# Patient Record
Sex: Male | Born: 1990 | Hispanic: Yes | Marital: Single | Smoking: Current every day smoker
Health system: Southern US, Community
[De-identification: ages and names within clinical notes are randomized; demographics above are authoritative.]

## PROBLEM LIST (undated history)

## (undated) HISTORY — PX: WISDOM TOOTH EXTRACTION: SHX21

---

## 2015-05-02 ENCOUNTER — Emergency Department (HOSPITAL_COMMUNITY): Payer: Commercial Managed Care - PPO

## 2015-05-02 ENCOUNTER — Emergency Department (HOSPITAL_COMMUNITY)
Admission: EM | Admit: 2015-05-02 | Discharge: 2015-05-02 | Disposition: A | Discharge status: Home / Self Care | Payer: Commercial Managed Care - PPO | Attending: Emergency Medicine | Admitting: Emergency Medicine

## 2015-05-02 ENCOUNTER — Encounter (HOSPITAL_COMMUNITY): Payer: Self-pay | Admitting: Emergency Medicine

## 2015-05-02 DIAGNOSIS — R1013 Epigastric pain: Secondary | ICD-10-CM | POA: Diagnosis not present

## 2015-05-02 DIAGNOSIS — R1010 Upper abdominal pain, unspecified: Secondary | ICD-10-CM | POA: Diagnosis present

## 2015-05-02 DIAGNOSIS — Z720 Tobacco use: Secondary | ICD-10-CM | POA: Diagnosis not present

## 2015-05-02 LAB — URINALYSIS, ROUTINE W REFLEX MICROSCOPIC
Bilirubin Urine: NEGATIVE
GLUCOSE, UA: NEGATIVE mg/dL
Hgb urine dipstick: NEGATIVE
Ketones, ur: NEGATIVE mg/dL
LEUKOCYTES UA: NEGATIVE
Nitrite: NEGATIVE
PROTEIN: NEGATIVE mg/dL
Specific Gravity, Urine: 1.01 (ref 1.005–1.030)
Urobilinogen, UA: 0.2 mg/dL (ref 0.0–1.0)
pH: 6.5 (ref 5.0–8.0)

## 2015-05-02 LAB — CBC WITH DIFFERENTIAL/PLATELET
BASOS ABS: 0 10*3/uL (ref 0.0–0.1)
BASOS PCT: 0 %
EOS ABS: 0 10*3/uL (ref 0.0–0.7)
EOS PCT: 0 %
HCT: 44.7 % (ref 39.0–52.0)
Hemoglobin: 15.6 g/dL (ref 13.0–17.0)
Lymphocytes Relative: 43 %
Lymphs Abs: 3 10*3/uL (ref 0.7–4.0)
MCH: 30.7 pg (ref 26.0–34.0)
MCHC: 34.9 g/dL (ref 30.0–36.0)
MCV: 88 fL (ref 78.0–100.0)
Monocytes Absolute: 0.4 10*3/uL (ref 0.1–1.0)
Monocytes Relative: 6 %
Neutro Abs: 3.5 10*3/uL (ref 1.7–7.7)
Neutrophils Relative %: 51 %
PLATELETS: 228 10*3/uL (ref 150–400)
RBC: 5.08 MIL/uL (ref 4.22–5.81)
RDW: 12.5 % (ref 11.5–15.5)
WBC: 7 10*3/uL (ref 4.0–10.5)

## 2015-05-02 LAB — COMPREHENSIVE METABOLIC PANEL
ALBUMIN: 4.9 g/dL (ref 3.5–5.0)
ALT: 13 U/L — AB (ref 17–63)
AST: 21 U/L (ref 15–41)
Alkaline Phosphatase: 60 U/L (ref 38–126)
Anion gap: 11 (ref 5–15)
BUN: 14 mg/dL (ref 6–20)
CHLORIDE: 102 mmol/L (ref 101–111)
CO2: 25 mmol/L (ref 22–32)
CREATININE: 0.74 mg/dL (ref 0.61–1.24)
Calcium: 9.1 mg/dL (ref 8.9–10.3)
GFR calc non Af Amer: >60 mL/min (ref >60)
Glucose, Bld: 96 mg/dL (ref 65–99)
Potassium: 3.3 mmol/L — ABNORMAL LOW (ref 3.5–5.1)
SODIUM: 138 mmol/L (ref 135–145)
Total Bilirubin: 0.8 mg/dL (ref 0.3–1.2)
Total Protein: 7.9 g/dL (ref 6.5–8.1)

## 2015-05-02 LAB — LIPASE, BLOOD: Lipase: 31 U/L (ref 22–51)

## 2015-05-02 MED ORDER — ONDANSETRON HCL 4 MG/2ML IJ SOLN
INTRAMUSCULAR | Status: AC
  Administered 2015-05-02: 4 mg via INTRAVENOUS
  Filled 2015-05-02: qty 2

## 2015-05-02 MED ORDER — SODIUM CHLORIDE 0.9 % IV BOLUS (SEPSIS)
1000.0000 mL | Freq: Once | INTRAVENOUS | Status: AC
  Administered 2015-05-02: 1000 mL via INTRAVENOUS

## 2015-05-02 MED ORDER — ONDANSETRON HCL 4 MG/2ML IJ SOLN
4.0000 mg | Freq: Once | INTRAMUSCULAR | Status: AC
  Administered 2015-05-02: 4 mg via INTRAVENOUS

## 2015-05-02 MED ORDER — OMEPRAZOLE 20 MG PO CPDR: 20.0000 mg | DELAYED_RELEASE_CAPSULE | Freq: Every day | ORAL | Status: DC

## 2015-05-02 NOTE — ED Provider Notes (Signed)
CSN: [id number]   Arrival date & time 05/02/15 0126  History  This chart was scribed for Eric Munch, MD by Bethel Born, ED Scribe. This patient was seen in room WA11/WA11 and the patient's care was started at 2:18 AM.  Chief Complaint  Patient presents with  . Abdominal Pain    HPI The history is provided by the patient. No language interpreter was used.   Isam Nanetti-Palacios is a 24 y.o. male who presents to the Emergency Department complaining of intermittent mid upper abdominal pain with onset 1 week ago. The most recent episode started 1 hour ago. Pt rates the pain 5/10 in severity. The pain is worse at night and with eating. Nothing improves the pain. He took nothing for pain PTA. Associated symptoms include nausea. He has been urinating less frequently. Pt denies diarrhea or other change in stool, change in appetite, chest pain, SOB. No recent diet change. No history of abdominal surgery. No tobacco use. Infrequent alcohol use.   History reviewed. No pertinent past medical history.  History reviewed. No pertinent past surgical history.  No family history on file.  Social History  Substance Use Topics  . Smoking status: Current Every Day Smoker  . Smokeless tobacco: None  . Alcohol Use: Yes     Review of Systems  Constitutional:       Per HPI, otherwise negative  HENT:       Per HPI, otherwise negative  Respiratory:       Per HPI, otherwise negative  Cardiovascular:       Per HPI, otherwise negative  Gastrointestinal: Negative for vomiting.  Endocrine:       Negative aside from HPI  Genitourinary:       Neg aside from HPI   Musculoskeletal:       Per HPI, otherwise negative  Skin: Negative.   Neurological: Negative for syncope.   Home Medications   Prior to Admission medications   Medication Sig Start Date End Date Taking? Authorizing Provider  ibuprofen (ADVIL,MOTRIN) 200 MG tablet Take 400 mg by mouth every 6 (six) hours as needed for moderate pain.    Yes Historical Provider, MD    Allergies  Review of patient's allergies indicates no known allergies.  Triage Vitals: BP 159/104 mmHg  Pulse 138  Temp(Src) 99.2 F (37.3 C) (Oral)  Resp 20  SpO2 99%  Physical Exam  Constitutional: He is oriented to person, place, and time. He appears well-developed. No distress.  HENT:  Head: Normocephalic and atraumatic.  Eyes: Conjunctivae and EOM are normal.  Cardiovascular: Normal rate and regular rhythm.   Pulmonary/Chest: Effort normal. No stridor. No respiratory distress.  Abdominal: He exhibits no distension.  Musculoskeletal: He exhibits no edema.  Neurological: He is alert and oriented to person, place, and time.  Skin: Skin is warm and dry.  Psychiatric: He has a normal mood and affect.  Nursing note and vitals reviewed.   ED Course  Procedures  COORDINATION OF CARE: 2:22 AM Discussed treatment plan which includes lab work, abdominal US, IVF, and antiemetic with pt at bedside and pt agreed to plan.  Labs Reviewed  COMPREHENSIVE METABOLIC PANEL - Abnormal; Notable for the following:    Potassium 3.3 (*)    ALT 13 (*)    All other components within normal limits  CBC WITH DIFFERENTIAL/PLATELET  LIPASE, BLOOD  URINALYSIS, ROUTINE W REFLEX MICROSCOPIC (NOT AT [facility])    Imaging Review US Abdomen Limited  05/02/2015   CLINICAL DATA:  Right  upper quadrant pain.  EXAM: US ABDOMEN LIMITED - RIGHT UPPER QUADRANT  COMPARISON:  None.  FINDINGS: Gallbladder:  No gallstones or wall thickening visualized. No sonographic Murphy sign noted.  Common bile duct:  Diameter: 2.8 mm, normal  Liver:  No focal lesion identified. Within normal limits in parenchymal echogenicity.  IMPRESSION: Normal examination.   Electronically Signed   By: Burman Nieves M.D.   On: 05/02/2015 03:14    3:57 AM On repeat exam the patient is pain-free. Now, the patient's family on room, states that he has been smoking substantial amounts of marijuana, daily, and  has been eating inconsistently.  We discussed all findings at length, including possibility of gastroesophageal etiology, given his reassuring findings. Patient denies any ongoing pain at all. We discussed return precautions, empiric therapy with PPI, gastroenterology follow-up.  MDM   I personally performed the services described in this documentation, which was scribed in my presence. The recorded information has been reviewed and is accurate.   Well appearing M presents with episodic epigastric discomfort. History the patient is awake, alert, with no evidence for perforated ulcer disease, nor peritonitis. No evidence for appendicitis, and the patient is pain-free after initial interventions here. With reassuring findings, ultrasound, the patient was discharged in stable condition to follow-up with gastroenterology.  Eric Munch, MD 05/02/15 [phone]

## 2015-05-02 NOTE — Discharge Instructions (Signed)
As discussed, your evaluation today has been largely reassuring.  But, it is important that you monitor your condition carefully, and do not hesitate to return to the ED if you develop new, or concerning changes in your condition. ? ?Otherwise, please follow-up with your physician for appropriate ongoing care. ? ?

## 2015-05-02 NOTE — ED Notes (Signed)
Pt c/o sharp "quick" pain to right side of abd. States its been going on x 2 weeks off and on but now it is staying. Pt now c/o nausea.

## 2017-02-20 IMAGING — US US ABDOMEN LIMITED
1 series · 14 of 25 positions shown · non-contrast
Comparison: None.

CLINICAL DATA: Right upper quadrant pain.

EXAM:
US ABDOMEN LIMITED - RIGHT UPPER QUADRANT

[Series 1: us abdomen limited · 0.16mm/px · 14 of 41 slices shown]
[im 1/41]
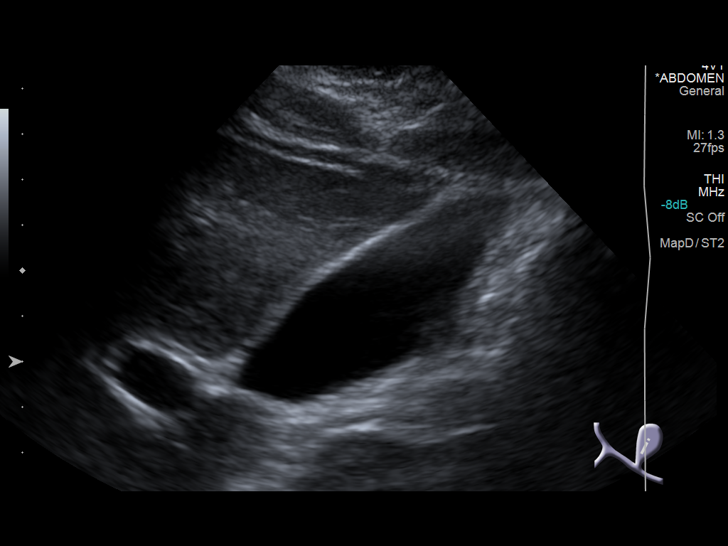
[im 4/41]
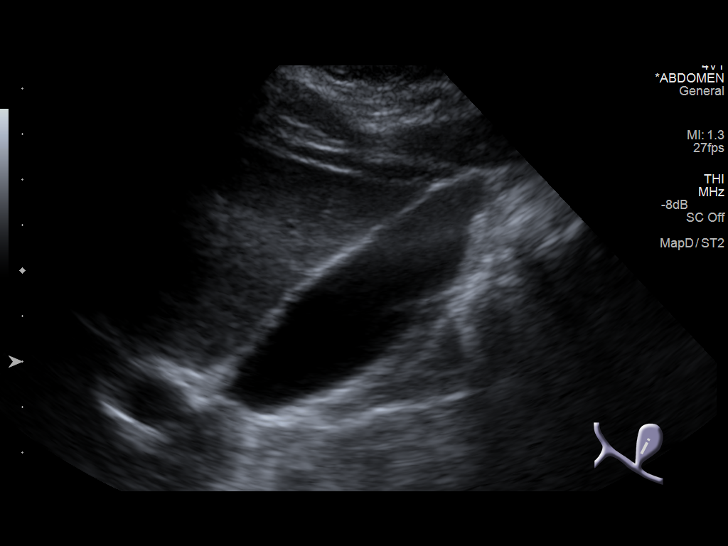
[im 7/41]
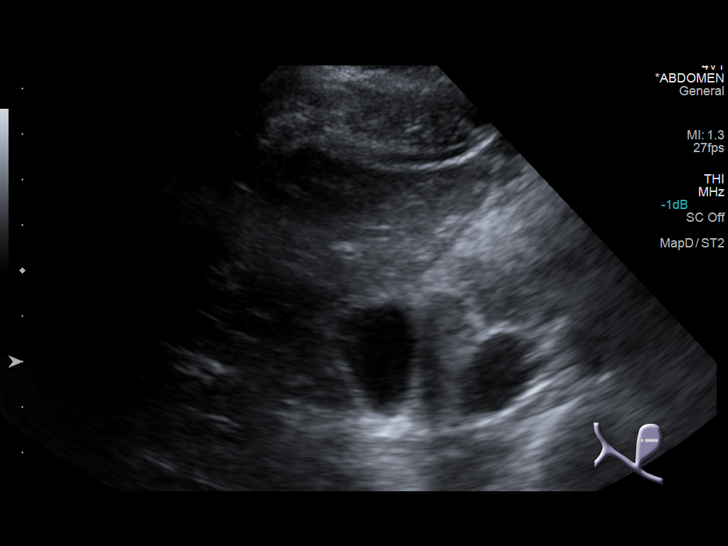
[im 11/41]
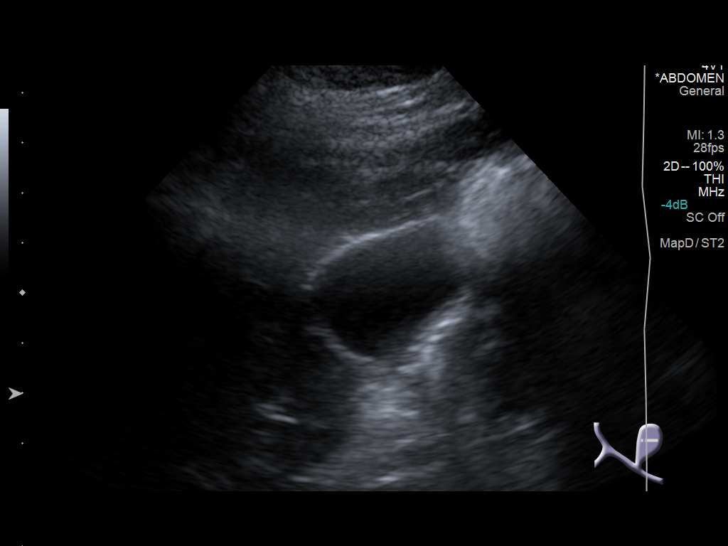
[im 14/41]
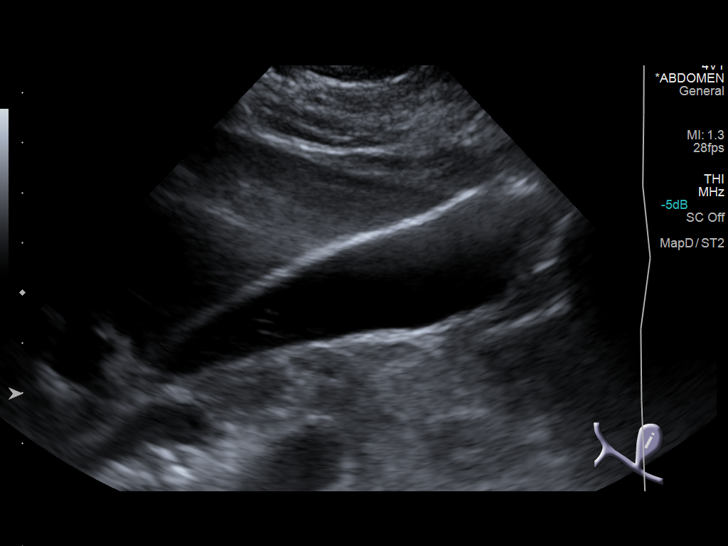
[im 16/41]
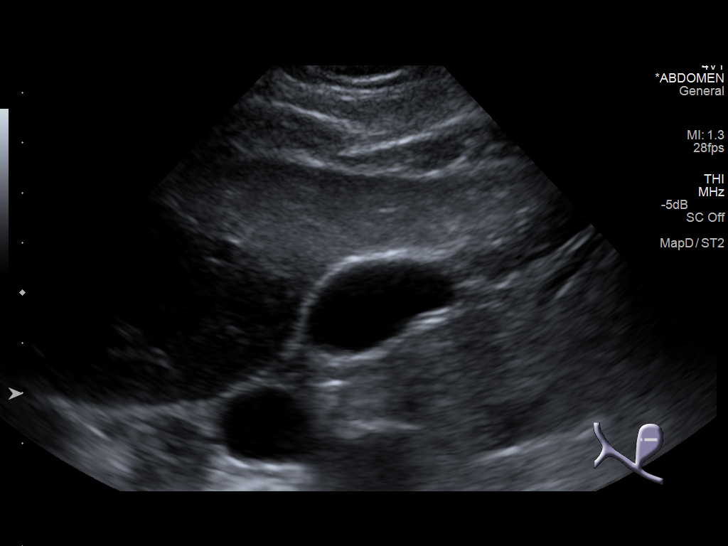
[im 19/41]
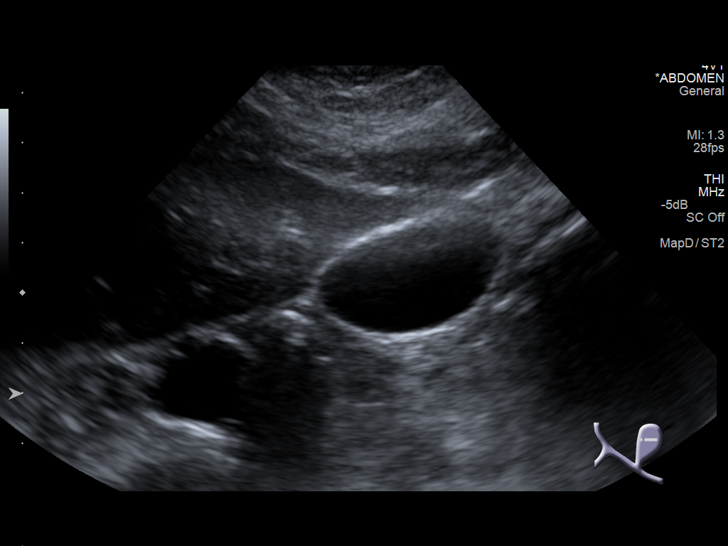
[im 22/41]
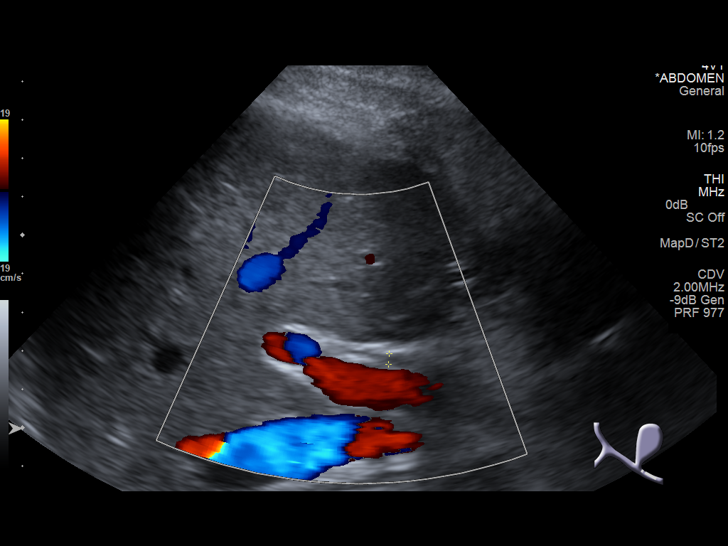
[im 26/41]
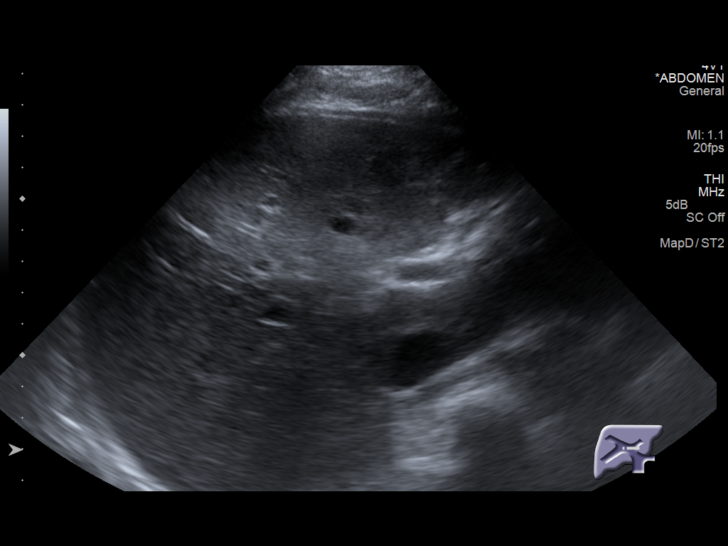
[im 27/41]
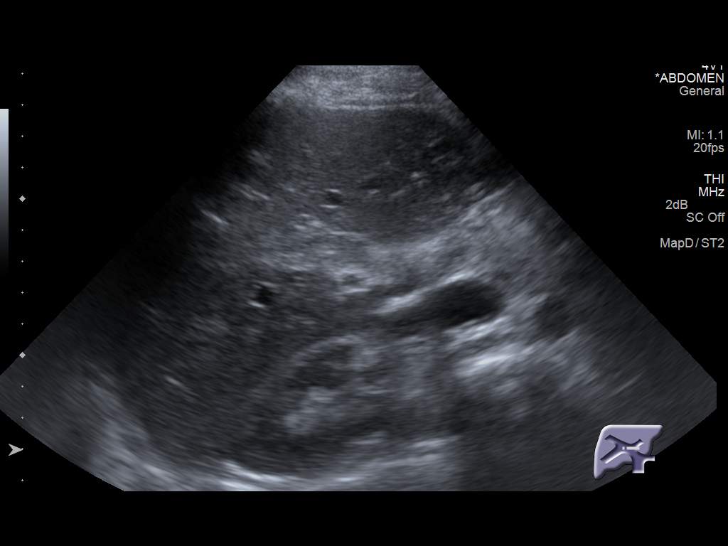
[im 31/41]
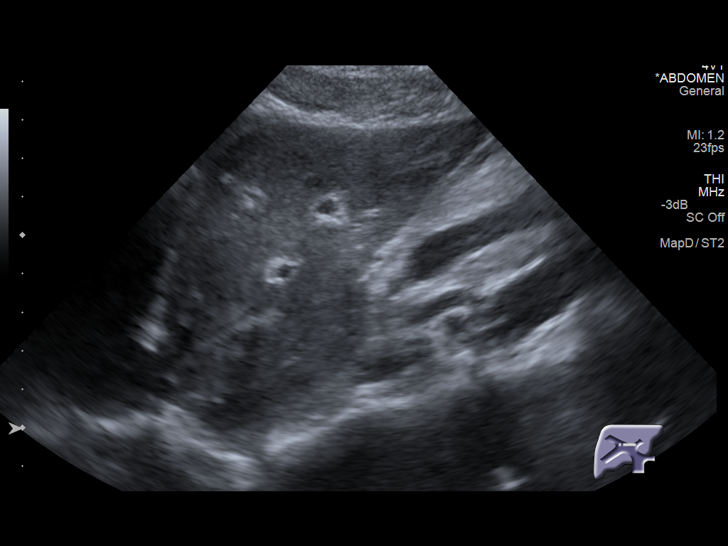
[im 34/41]
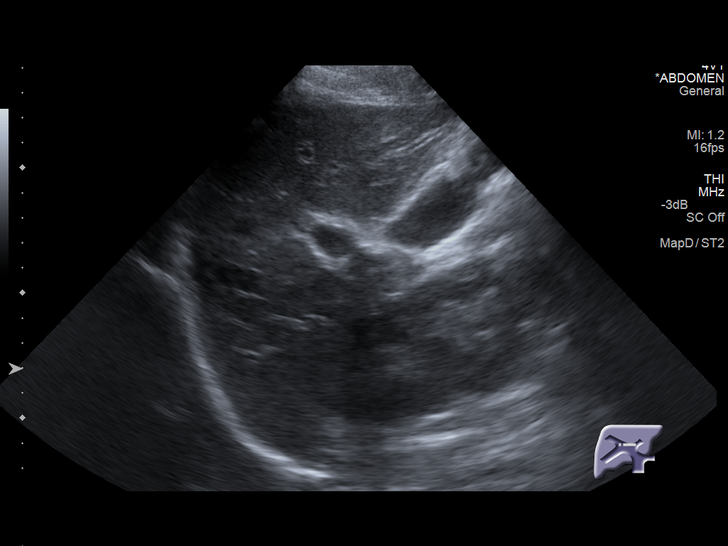
[im 37/41]
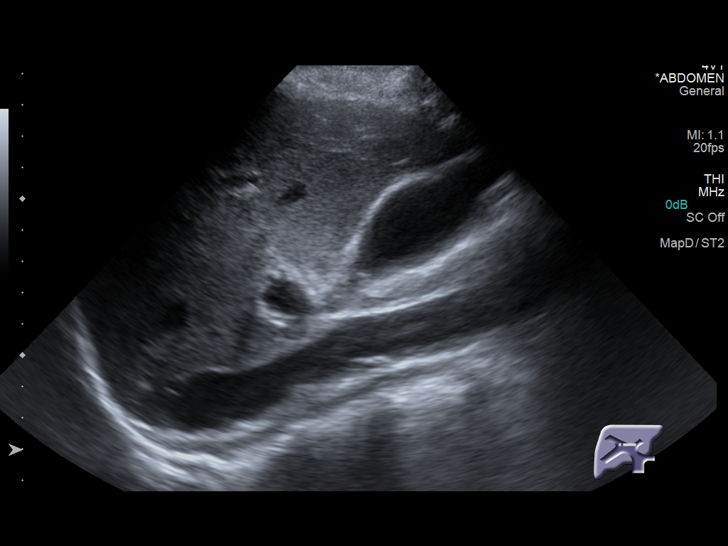
[im 41/41]
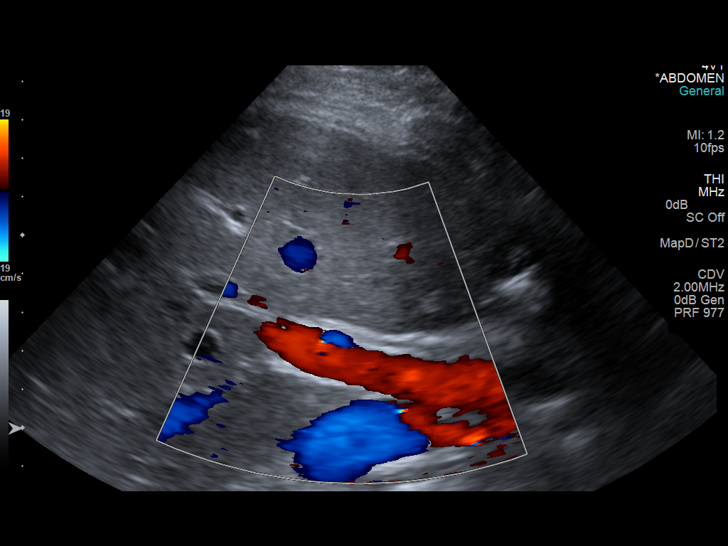

[14 of 25 positions shown; findings below may reference images not displayed]

FINDINGS: Gallbladder:

No gallstones or wall thickening visualized. No sonographic Murphy
sign noted.

Common bile duct:

Diameter: 2.8 mm, normal

Liver:

No focal lesion identified. Within normal limits in parenchymal
echogenicity.
IMPRESSION: Normal examination.

## 2020-06-12 ENCOUNTER — Other Ambulatory Visit: Payer: Self-pay

## 2020-06-12 ENCOUNTER — Encounter (HOSPITAL_COMMUNITY): Payer: Self-pay

## 2020-06-12 ENCOUNTER — Ambulatory Visit (HOSPITAL_COMMUNITY)
Admission: EM | Admit: 2020-06-12 | Discharge: 2020-06-12 | Disposition: A | Discharge status: Home / Self Care | Payer: BC Managed Care – PPO | Attending: Family Medicine | Admitting: Family Medicine

## 2020-06-12 DIAGNOSIS — J029 Acute pharyngitis, unspecified: Secondary | ICD-10-CM

## 2020-06-12 LAB — POCT RAPID STREP A, ED / UC: Streptococcus, Group A Screen (Direct): NEGATIVE

## 2020-06-12 MED ORDER — AMOXICILLIN 875 MG PO TABS: 875.0000 mg | ORAL_TABLET | Freq: Two times a day (BID) | ORAL | 0 refills | Status: AC

## 2020-06-12 NOTE — ED Provider Notes (Signed)
[facility]    CSN: [id number] Arrival date & time: 06/12/20  1610      History   Chief Complaint Chief Complaint  Patient presents with  . Sore Throat    HPI Eric Werner is a 29 y.o. male.   HPI Patient presents today with sore throat and chills.  Patient is afebrile. He endorses that throat has been sore for over a week.  Patient is a current daily smoker.  He is unaware of any recent fever or sick contacts.  He denies any recurrent history of strep infection.  The pain has not improved with OTC medications.  He endorses pain with swallowing fluids and solid foods.  He has no other URI symptoms.  He endorses an intermittent headache since symptoms begun.   History reviewed. No pertinent past medical history.  There are no problems to display for this patient.   History reviewed. No pertinent surgical history.     Home Medications    Prior to Admission medications   Medication Sig Start Date End Date Taking? Authorizing Provider  ibuprofen (ADVIL,MOTRIN) 200 MG tablet Take 400 mg by mouth every 6 (six) hours as needed for moderate pain.    [provider]  omeprazole (PRILOSEC) 20 MG capsule Take 1 capsule (20 mg total) by mouth daily. 05/02/15   Gerhard Munch, MD    Family History Family History  Family history unknown: Yes    Social History Social History   Tobacco Use  . Smoking status: Current Every Day Smoker  Substance Use Topics  . Alcohol use: Yes  . Drug use: Not on file     Allergies   Patient has no known allergies.  Review of Systems Review of Systems Pertinent negatives listed in HPI   Physical Exam Triage Vital Signs ED Triage Vitals [06/12/20 1707]  Enc Vitals Group     BP      Pulse      Resp      Temp      Temp src      SpO2      Weight      Height      Head Circumference      Peak Flow      Pain Score 7     Pain Loc      Pain Edu?      Excl. in GC?    No data found.  Updated  Vital Signs There were no vitals taken for this visit.  Visual Acuity Right Eye Distance:   Left Eye Distance:   Bilateral Distance:    Right Eye Near:   Left Eye Near:    Bilateral Near:     Physical Exam HENT:     Mouth/Throat:     Pharynx: Pharyngeal swelling, oropharyngeal exudate, posterior oropharyngeal erythema and uvula swelling present.     Tonsils: Tonsillar exudate present. 3+ on the right. 3+ on the left.  Cardiovascular:     Rate and Rhythm: Normal rate and regular rhythm.  Pulmonary:     Effort: Pulmonary effort is normal.     Breath sounds: Normal breath sounds.  Skin:    General: Skin is warm.     Capillary Refill: Capillary refill takes less than 2 seconds.  Neurological:     General: No focal deficit present.     Mental Status: He is alert and oriented to person, place, and time.  Psychiatric:        Mood and Affect: Mood  normal.      UC Treatments / Results  Labs (all labs ordered are listed, but only abnormal results are displayed) Labs Reviewed - No data to display  EKG   Radiology No results found.  Procedures Procedures (including critical care time)  Medications Ordered in UC Medications - No data to display  Initial Impression / Assessment and Plan / UC Course  I have reviewed the triage vital signs and the nursing notes.  Pertinent labs & imaging results that were available during my care of the patient were reviewed by me and considered in my medical decision making (see chart for details).    Acute pharyngitis given appearance of throat will cover for possible strep while awaiting culture.  Treating with amoxicillin 875 twice daily x5 days to allow time for culture to result.  Patient encouraged to gargle with warm salt water continue ibuprofen as needed for pain.  If symptoms worsen or do not improve return for evaluation.   Final Clinical Impressions(s) / UC Diagnoses   Final diagnoses:  Acute pharyngitis, unspecified  etiology   Discharge Instructions   None    ED Prescriptions    Medication Sig Dispense Auth. Provider   amoxicillin (AMOXIL) 875 MG tablet Take 1 tablet (875 mg total) by mouth 2 (two) times daily for 5 days. 10 tablet Bing Neighbors, FNP     PDMP not reviewed this encounter.   Bing Neighbors, FNP 06/16/20 1150

## 2020-06-12 NOTE — ED Triage Notes (Signed)
Pt presents with sore throat X 1 week. 

## 2020-06-14 LAB — CULTURE, GROUP A STREP (THRC)

## 2020-06-19 DIAGNOSIS — Z7185 Encounter for immunization safety counseling: Secondary | ICD-10-CM | POA: Diagnosis not present

## 2020-06-19 DIAGNOSIS — G4700 Insomnia, unspecified: Secondary | ICD-10-CM | POA: Diagnosis not present

## 2020-06-19 DIAGNOSIS — F411 Generalized anxiety disorder: Secondary | ICD-10-CM | POA: Diagnosis not present

## 2020-06-19 DIAGNOSIS — F331 Major depressive disorder, recurrent, moderate: Secondary | ICD-10-CM | POA: Diagnosis not present

## 2020-06-24 DIAGNOSIS — Z0000 Encounter for general adult medical examination without abnormal findings: Secondary | ICD-10-CM | POA: Diagnosis not present

## 2020-07-01 DIAGNOSIS — R739 Hyperglycemia, unspecified: Secondary | ICD-10-CM | POA: Diagnosis not present

## 2020-07-01 DIAGNOSIS — H6590 Unspecified nonsuppurative otitis media, unspecified ear: Secondary | ICD-10-CM | POA: Diagnosis not present

## 2020-07-01 DIAGNOSIS — Z23 Encounter for immunization: Secondary | ICD-10-CM | POA: Diagnosis not present

## 2020-07-01 DIAGNOSIS — Z0000 Encounter for general adult medical examination without abnormal findings: Secondary | ICD-10-CM | POA: Diagnosis not present

## 2020-07-17 DIAGNOSIS — J309 Allergic rhinitis, unspecified: Secondary | ICD-10-CM | POA: Diagnosis not present

## 2020-07-17 DIAGNOSIS — Z1159 Encounter for screening for other viral diseases: Secondary | ICD-10-CM | POA: Diagnosis not present

## 2020-07-30 DIAGNOSIS — Z03818 Encounter for observation for suspected exposure to other biological agents ruled out: Secondary | ICD-10-CM | POA: Diagnosis not present

## 2020-08-12 DIAGNOSIS — F331 Major depressive disorder, recurrent, moderate: Secondary | ICD-10-CM | POA: Diagnosis not present

## 2020-08-12 DIAGNOSIS — F411 Generalized anxiety disorder: Secondary | ICD-10-CM | POA: Diagnosis not present

## 2020-08-12 DIAGNOSIS — G4700 Insomnia, unspecified: Secondary | ICD-10-CM | POA: Diagnosis not present

## 2020-08-12 DIAGNOSIS — J309 Allergic rhinitis, unspecified: Secondary | ICD-10-CM | POA: Diagnosis not present

## 2020-09-09 DIAGNOSIS — F411 Generalized anxiety disorder: Secondary | ICD-10-CM | POA: Diagnosis not present

## 2020-09-09 DIAGNOSIS — G4700 Insomnia, unspecified: Secondary | ICD-10-CM | POA: Diagnosis not present

## 2020-09-09 DIAGNOSIS — F331 Major depressive disorder, recurrent, moderate: Secondary | ICD-10-CM | POA: Diagnosis not present

## 2020-09-09 DIAGNOSIS — F1210 Cannabis abuse, uncomplicated: Secondary | ICD-10-CM | POA: Diagnosis not present

## 2020-10-08 DIAGNOSIS — G4700 Insomnia, unspecified: Secondary | ICD-10-CM | POA: Diagnosis not present

## 2020-10-08 DIAGNOSIS — F411 Generalized anxiety disorder: Secondary | ICD-10-CM | POA: Diagnosis not present

## 2020-10-08 DIAGNOSIS — F1210 Cannabis abuse, uncomplicated: Secondary | ICD-10-CM | POA: Diagnosis not present

## 2020-10-08 DIAGNOSIS — F331 Major depressive disorder, recurrent, moderate: Secondary | ICD-10-CM | POA: Diagnosis not present

## 2022-05-18 ENCOUNTER — Encounter (HOSPITAL_COMMUNITY): Payer: Self-pay | Admitting: Emergency Medicine

## 2022-05-18 ENCOUNTER — Ambulatory Visit (INDEPENDENT_AMBULATORY_CARE_PROVIDER_SITE_OTHER): Payer: BLUE CROSS/BLUE SHIELD

## 2022-05-18 ENCOUNTER — Ambulatory Visit (HOSPITAL_COMMUNITY)
Admission: EM | Admit: 2022-05-18 | Discharge: 2022-05-18 | Disposition: A | Discharge status: Home / Self Care | Payer: BLUE CROSS/BLUE SHIELD | Attending: Family Medicine | Admitting: Family Medicine

## 2022-05-18 DIAGNOSIS — R0789 Other chest pain: Secondary | ICD-10-CM

## 2022-05-18 DIAGNOSIS — R079 Chest pain, unspecified: Secondary | ICD-10-CM | POA: Diagnosis not present

## 2022-05-18 MED ORDER — IBUPROFEN 800 MG PO TABS: 800.0000 mg | ORAL_TABLET | Freq: Three times a day (TID) | ORAL | 0 refills | Status: AC | PRN

## 2022-05-18 MED ORDER — KETOROLAC TROMETHAMINE 30 MG/ML IJ SOLN
30.0000 mg | Freq: Once | INTRAMUSCULAR | Status: AC
  Administered 2022-05-18: 30 mg via INTRAMUSCULAR

## 2022-05-18 MED ORDER — KETOROLAC TROMETHAMINE 30 MG/ML IJ SOLN
INTRAMUSCULAR | Status: AC
  Filled 2022-05-18: qty 1

## 2022-05-18 NOTE — ED Provider Notes (Signed)
[facility]    CSN: [id number] Arrival date & time: 05/18/22  1556      History   Chief Complaint Chief Complaint  Patient presents with   Chest Pain    HPI Eric Werner is a 31 y.o. male.    Chest Pain  Here for left-sided chest pain that is been going on for about 2 months.  In the last 2 weeks at least it has been fairly constant.  It will worsen at times.  About 2 weeks ago he thought maybe it was happening more when he had been vaping some THC.  He quit vaping that and it seemed to help but then it the pain is come back.  He does vape nicotine all day every day.  No recent fever or chills or cough.    He had become very intense earlier today so that caused him to come here to the urgent care for evaluation.  He had some pain in his left jaw and his left arm.  Also his lips are tingling    History reviewed. No pertinent past medical history.  There are no problems to display for this patient.   Past Surgical History:  Procedure Laterality Date   WISDOM TOOTH EXTRACTION Bilateral        Home Medications    Prior to Admission medications   Medication Sig Start Date End Date Taking? Authorizing Provider  ibuprofen (ADVIL) 800 MG tablet Take 1 tablet (800 mg total) by mouth every 8 (eight) hours as needed (pain). 05/18/22  Yes Roena Sassaman, Janace Aris, MD    Family History Family History  Family history unknown: Yes    Social History Social History   Tobacco Use   Smoking status: Every Day  Vaping Use   Vaping Use: Every day   Substances: Nicotine  Substance Use Topics   Alcohol use: Yes   Drug use: Not Currently    Types: Methamphetamines     Allergies   Patient has no known allergies.   Review of Systems Review of Systems  Cardiovascular:  Positive for chest pain.     Physical Exam Triage Vital Signs ED Triage Vitals  Enc Vitals Group     BP 05/18/22 1641 128/86     Pulse Rate 05/18/22 1641 84     Resp  05/18/22 1641 14     Temp 05/18/22 1641 98.1 F (36.7 C)     Temp Source 05/18/22 1641 Oral     SpO2 05/18/22 1641 99 %     Weight --      Height --      Head Circumference --      Peak Flow --      Pain Score 05/18/22 1639 4     Pain Loc --      Pain Edu? --      Excl. in GC? --    No data found.  Updated Vital Signs BP 128/86 (BP Location: Right Arm)   Pulse 84   Temp 98.1 F (36.7 C) (Oral)   Resp 14   SpO2 99%   Visual Acuity Right Eye Distance:   Left Eye Distance:   Bilateral Distance:    Right Eye Near:   Left Eye Near:    Bilateral Near:     Physical Exam Vitals reviewed.  Constitutional:      General: He is not in acute distress.    Appearance: He is not ill-appearing, toxic-appearing or diaphoretic.  Eyes:  Extraocular Movements: Extraocular movements intact.     Conjunctiva/sclera: Conjunctivae normal.     Pupils: Pupils are equal, round, and reactive to light.  Cardiovascular:     Rate and Rhythm: Normal rate and regular rhythm.     Heart sounds: No murmur heard. Pulmonary:     Effort: No respiratory distress.     Breath sounds: No stridor. No wheezing, rhonchi or rales.  Chest:     Chest wall: Tenderness (Left upper and left lower anterior chest.) present.  Musculoskeletal:     Cervical back: Neck supple.  Lymphadenopathy:     Cervical: No cervical adenopathy.  Skin:    Coloration: Skin is not pale.     Findings: No rash.  Neurological:     General: No focal deficit present.     Mental Status: He is alert and oriented to person, place, and time.     Sensory: Sensory deficit: .urg.  Psychiatric:        Behavior: Behavior normal.      UC Treatments / Results  Labs (all labs ordered are listed, but only abnormal results are displayed) Labs Reviewed - No data to display  EKG   Radiology DG Chest 2 View  Result Date: 05/18/2022 CLINICAL DATA:  LEFT chest pain for 2 months, recently stopped vaping marijuana EXAM: CHEST - 2 VIEW  COMPARISON:  None Available. FINDINGS: Normal heart size, mediastinal contours, and pulmonary vascularity. Lungs clear. No pleural effusion or pneumothorax. Bones unremarkable. IMPRESSION: Normal exam. Electronically Signed   By: Lavonia Dana M.D.   On: 05/18/2022 17:25    Procedures Procedures (including critical care time)  Medications Ordered in UC Medications  ketorolac (TORADOL) 30 MG/ML injection 30 mg (has no administration in time range)    Initial Impression / Assessment and Plan / UC Course  I have reviewed the triage vital signs and the nursing notes.  Pertinent labs & imaging results that were available during my care of the patient were reviewed by me and considered in my medical decision making (see chart for details).       Chest x-ray is normal.  At least some of this seems to be chest wall pain.  He is given a shot of Toradol and prescription strength ibuprofen.  Assistance is requested to help him find a physician, but he could also self schedule with the PCP using the website at the back of the discharge summary.  We discussed how it might help also to quit vaping nicotine.  Final Clinical Impressions(s) / UC Diagnoses   Final diagnoses:  Chest wall pain     Discharge Instructions      Your chest x-ray was clear  You have been given a shot of Toradol 30 mg today.  Take ibuprofen 800 mg--1 tab every 8 hours as needed for pain.  You can use the QR code/website at the back of the summary paperwork to schedule yourself a new patient appointment with primary care       ED Prescriptions     Medication Sig Dispense Auth. Provider   ibuprofen (ADVIL) 800 MG tablet Take 1 tablet (800 mg total) by mouth every 8 (eight) hours as needed (pain). 21 tablet Elexius Minar, Gwenlyn Perking, MD      I have reviewed the PDMP during this encounter.   Barrett Henle, MD 05/18/22 [phone]

## 2022-05-18 NOTE — ED Triage Notes (Signed)
Pt reports central to left chest pains for 2 months. Reports was vaping marijuana and stopped cold [place] and initially went away. Vapes nicotine and thinks that when does that pain is worse.  Took ibuprofen today that didn't help.

## 2022-05-18 NOTE — Discharge Instructions (Addendum)
Your chest x-ray was clear  You have been given a shot of Toradol 30 mg today.  Take ibuprofen 800 mg--1 tab every 8 hours as needed for pain.  You can use the QR code/website at the back of the summary paperwork to schedule yourself a new patient appointment with primary care

## 2022-05-20 ENCOUNTER — Encounter (HOSPITAL_COMMUNITY): Payer: Self-pay
